# Patient Record
Sex: Male | Born: 1955 | Race: White | Hispanic: No | Marital: Married | Smoking: Current every day smoker
Health system: Southern US, Community
[De-identification: ages and names within clinical notes are randomized; demographics above are authoritative.]

---

## 2020-05-09 ENCOUNTER — Ambulatory Visit (INDEPENDENT_AMBULATORY_CARE_PROVIDER_SITE_OTHER): Payer: Worker's Compensation | Admitting: Orthopaedic Surgery

## 2020-05-09 ENCOUNTER — Other Ambulatory Visit: Payer: Self-pay

## 2020-05-09 ENCOUNTER — Encounter: Payer: Self-pay | Admitting: Orthopaedic Surgery

## 2020-05-09 DIAGNOSIS — S40021A Contusion of right upper arm, initial encounter: Secondary | ICD-10-CM | POA: Diagnosis not present

## 2020-05-09 DIAGNOSIS — S40011A Contusion of right shoulder, initial encounter: Secondary | ICD-10-CM | POA: Diagnosis not present

## 2020-05-09 NOTE — Progress Notes (Signed)
Office Visit Note   Patient: Jacob Stephenson           Date of Birth: [birth year]           MRN: [id number] Visit Date: 05/09/2020              Requested by: No referring provider defined for this encounter. PCP: Heloise Ochoa, NP   Assessment & Plan: Visit Diagnoses:  1. Contusion of multiple sites of right shoulder and upper arm, initial encounter     Plan: Work slip given for continue regular work I will recheck him in 2 weeks.  If he continues to improve will defer MRI imaging of his right shoulder.  Elbow laceration with closure appears to be doing well.  He can continue the ibuprofen 800 mg maximum of 2 a day once twice week he can take 1/3 tablet.  He also can use some plain Tylenol.  Work slip given for regular work.  Recheck 2 weeks.  Follow-Up Instructions: Return in about 2 weeks (around 05/23/2020).   Orders:  No orders of the defined types were placed in this encounter.  No orders of the defined types were placed in this encounter.     Procedures: No procedures performed   Clinical Data: No additional findings.   Subjective: Chief Complaint  Patient presents with  . 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  He had abrasions over the olecranon process.  Past history of gout has been taking ibuprofen has had trouble lifting his arm up over his head.  He states at the end the day his arm is been throbbing.  He had sutures applied to his elbow and was placed on antibiotics this now has an eschar present over the olecranon bursa without cellulitis.  He denies problems with elbow flexion extension.  He has noticed some numbness in the middle 3 fingers of his right hand none on his left hand.  He denies associated neck pain no balance issues no lower extremity problems.  Review of  Systems Positive for hypertension and past history of gout not on any medications in several years for his gout.  Objective: Vital Signs: BP 140/68   Pulse (!) 40   Ht 5\' 10"  (1.778 m)   Wt 250 lb (113.4 kg)   BMI 35.87 kg/m   Physical Exam Constitutional:      Appearance: He is well-developed.  HENT:     Head: Normocephalic and atraumatic.  Eyes:     Pupils: Pupils are equal, round, and reactive to light.  Neck:     Thyroid: No thyromegaly.     Trachea: No tracheal deviation.  Cardiovascular:     Rate and Rhythm: Normal rate.  Pulmonary:     Effort: Pulmonary effort is normal.     Breath sounds: No wheezing.  Abdominal:     General: Bowel sounds are normal.     Palpations: Abdomen is soft.  Skin:    General: Skin is warm and dry.     Capillary Refill: Capillary refill takes less than 2 seconds.  Neurological:     Mental Status: He is alert and oriented to person, place, and time.  Psychiatric:        Behavior: Behavior normal.        Thought  Content: Thought content normal.        Judgment: Judgment normal.     Ortho Exam patient has negative Spurling no brachial plexus tenderness.  Pain with abduction of the shoulder limited to 70 degrees.  He can flex the shoulder and get his hand on top of his head.  Full supination pronation.  Long head of the biceps moderately tender pain with resisted supraspinatus testing.  He has calluses present over hypothenar region of the right hand not on the left.  Some calluses directly over the carpal canal and some discomfort with carpal compression.  No thenar atrophy.  Interossei take good resistance.  Mild swelling in the right fingers and hand.  Normal heel toe gait.  2 cm eschar over the olecranon skin without cellulitis no significant olecranon bursal fluid.  Triceps takes good strength.  Upper extremity reflexes biceps triceps brachial radialis are 2+ and symmetrical.  Specialty Comments:  No specialty comments  available.  Imaging: No results found.   PMFS History: Patient Active Problem List   Diagnosis Date Noted  . Contusion of multiple sites of right shoulder and upper arm 05/09/2020   No past medical history on file.  No family history on file.   Social History   Occupational History  . Not on file  Tobacco Use  . Smoking status: Current Every Day Smoker  . Smokeless tobacco: Never Used  Substance and Sexual Activity  . Alcohol use: Not Currently  . Drug use: Not on file  . Sexual activity: Not on file

## 2020-05-23 ENCOUNTER — Other Ambulatory Visit: Payer: Self-pay | Admitting: Orthopaedic Surgery

## 2020-05-23 ENCOUNTER — Other Ambulatory Visit: Payer: Self-pay

## 2020-05-23 ENCOUNTER — Ambulatory Visit (INDEPENDENT_AMBULATORY_CARE_PROVIDER_SITE_OTHER): Payer: Worker's Compensation | Admitting: Orthopaedic Surgery

## 2020-05-23 ENCOUNTER — Encounter: Payer: Self-pay | Admitting: Orthopaedic Surgery

## 2020-05-23 VITALS — BP 137/68 | HR 45 | Ht 70.0 in | Wt 250.0 lb

## 2020-05-23 DIAGNOSIS — S40021D Contusion of right upper arm, subsequent encounter: Secondary | ICD-10-CM

## 2020-05-23 DIAGNOSIS — S40011D Contusion of right shoulder, subsequent encounter: Secondary | ICD-10-CM | POA: Diagnosis not present

## 2020-05-23 NOTE — Addendum Note (Signed)
Addended by: Penne Lash, Otis Dials on: 05/23/2020 09:58 AM   Modules accepted: Orders

## 2020-05-23 NOTE — Progress Notes (Signed)
Office Visit Note   Patient: Jacob Stephenson           Date of Birth: 12-23-1956           MRN: [id number] Visit Date: 05/23/2020              Requested by: Heloise Ochoa, NP [place],  [place] [address] PCP: Heloise Ochoa, NP   Assessment & Plan: Visit Diagnoses:  1. Contusion of multiple sites of right shoulder and upper arm, subsequent encounter     Plan: Patient is having persistent symptoms with supraspinatus weakness.  He is gotten slightly better in the last 2 weeks.  I recommend proceeding with MRI scan right shoulder for evaluation of his rotator cuff.  The elbow laceration is healing nicely with eschar no further treatment is required for this.  Office follow-up after shoulder MRI.  He remains on regular duty.  Follow-Up Instructions: return after MRI   Orders:  No orders of the defined types were placed in this encounter.  No orders of the defined types were placed in this encounter.     Procedures: No procedures performed   Clinical Data: No additional findings.   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  He injured his right shoulder has had persistent symptoms.  He states he has gotten a little bit better in the last 2 weeks but still has pain inability to abduct past 70 degrees for the right arm.  He has problems with pain at night.  He occasionally has some numbness in his fingers radial 3 fingers.  Numbness hand bothers him at night at times but he had some of this before the on-the-job injury to his shoulder.  Review of Systems 14 point system update unchanged from 05/09/2020 office visit.  Of note is that history of gout not on any current gout medication.   Objective: Vital Signs: BP 137/68 (BP Location: Left Arm,  Patient Position: Sitting)    Pulse (!) 45    Ht 5\' 10"  (1.778 m)    Wt 250 lb (113.4 kg)    BMI 35.87 kg/m   Physical Exam Constitutional:      Appearance: He is well-developed.  HENT:     Head: Normocephalic and atraumatic.  Eyes:     Pupils: Pupils are equal, round, and reactive to light.  Neck:     Thyroid: No thyromegaly.     Trachea: No tracheal deviation.  Cardiovascular:     Rate and Rhythm: Normal rate.  Pulmonary:     Effort: Pulmonary effort is normal.     Breath sounds: No wheezing.  Abdominal:     General: Bowel sounds are normal.     Palpations: Abdomen is soft.  Skin:    General: Skin is warm and dry.     Capillary Refill: Capillary refill takes less than 2 seconds.  Neurological:     Mental Status: He is alert and oriented to person, place, and time.  Psychiatric:        Behavior: Behavior normal.        Thought Content: Thought content normal.        Judgment: Judgment normal.     Ortho Exam patient has abduction only 70 degrees.  Long head of the biceps is intact.  He has good cervical range of motion.  No thenar atrophy.  Eschar over the olecranon noted where previous laceration which is healing no cellulitis no drainage.  Supraspinatus weakness with resisted testing.  Subscap and external rotation are strong.  Good biceps strength no distal biceps migration.  No thenar atrophy.  Specialty Comments:  No specialty comments available.  Imaging: No results found.   PMFS History: Patient Active Problem List   Diagnosis Date Noted   Contusion of multiple sites of right shoulder and upper arm 05/09/2020   No past medical history on file.  No family history on file.   Social History   Occupational History   Not on file  Tobacco Use   Smoking status: Current Every Day Smoker   Smokeless tobacco: Never Used  Substance and Sexual Activity   Alcohol use: Not Currently   Drug use: Not on file   Sexual activity: Not on file

## 2020-05-24 ENCOUNTER — Ambulatory Visit
Admission: RE | Admit: 2020-05-24 | Discharge: 2020-05-24 | Disposition: A | Discharge status: Home / Self Care | Payer: Managed Care, Other (non HMO) | Source: Ambulatory Visit | Attending: Orthopaedic Surgery | Admitting: Orthopaedic Surgery

## 2020-05-24 ENCOUNTER — Other Ambulatory Visit: Payer: Self-pay | Admitting: Orthopaedic Surgery

## 2020-05-24 DIAGNOSIS — Z77018 Contact with and (suspected) exposure to other hazardous metals: Secondary | ICD-10-CM

## 2020-05-24 DIAGNOSIS — S40011D Contusion of right shoulder, subsequent encounter: Secondary | ICD-10-CM

## 2020-05-24 DIAGNOSIS — S40021D Contusion of right upper arm, subsequent encounter: Secondary | ICD-10-CM

## 2020-05-26 ENCOUNTER — Other Ambulatory Visit: Payer: Self-pay

## 2020-05-26 ENCOUNTER — Ambulatory Visit
Admission: RE | Admit: 2020-05-26 | Discharge: 2020-05-26 | Disposition: A | Discharge status: Home / Self Care | Payer: Self-pay | Source: Ambulatory Visit | Attending: Orthopaedic Surgery | Admitting: Orthopaedic Surgery

## 2020-05-26 DIAGNOSIS — S40011D Contusion of right shoulder, subsequent encounter: Secondary | ICD-10-CM

## 2020-05-26 DIAGNOSIS — S40021D Contusion of right upper arm, subsequent encounter: Secondary | ICD-10-CM

## 2020-05-26 DIAGNOSIS — Z77018 Contact with and (suspected) exposure to other hazardous metals: Secondary | ICD-10-CM

## 2020-05-28 ENCOUNTER — Ambulatory Visit
Admission: RE | Admit: 2020-05-28 | Discharge: 2020-05-28 | Disposition: A | Discharge status: Home / Self Care | Payer: Worker's Compensation | Source: Ambulatory Visit | Attending: Orthopaedic Surgery | Admitting: Orthopaedic Surgery

## 2020-05-28 DIAGNOSIS — S40011D Contusion of right shoulder, subsequent encounter: Secondary | ICD-10-CM

## 2020-05-28 DIAGNOSIS — S40021D Contusion of right upper arm, subsequent encounter: Secondary | ICD-10-CM

## 2020-05-31 ENCOUNTER — Ambulatory Visit (INDEPENDENT_AMBULATORY_CARE_PROVIDER_SITE_OTHER): Payer: Worker's Compensation | Admitting: Orthopaedic Surgery

## 2020-05-31 ENCOUNTER — Encounter: Payer: Self-pay | Admitting: Orthopaedic Surgery

## 2020-05-31 DIAGNOSIS — S46011D Strain of muscle(s) and tendon(s) of the rotator cuff of right shoulder, subsequent encounter: Secondary | ICD-10-CM | POA: Diagnosis not present

## 2020-05-31 DIAGNOSIS — M75121 Complete rotator cuff tear or rupture of right shoulder, not specified as traumatic: Secondary | ICD-10-CM | POA: Insufficient documentation

## 2020-05-31 DIAGNOSIS — M7521 Bicipital tendinitis, right shoulder: Secondary | ICD-10-CM

## 2020-05-31 NOTE — Progress Notes (Signed)
Office Visit Note   Patient: Jacob Stephenson           Date of Birth: 08-14-1956           MRN: [id number] Visit Date: 05/31/2020              Requested by: Heloise Ochoa, NP [address],  [place] [address] PCP: Heloise Ochoa, NP   Assessment & Plan: Visit Diagnoses:  1. Traumatic complete tear of right rotator cuff, subsequent encounter   2. Tendonitis of long head of biceps brachii of right shoulder     Plan: Patient has severe tenderness supraspinatus with full-thickness tear 13 mm retraction but the muscle does not have atrophy.  The rest the rotator cuff is intact he does have moderate tendinosis of the intra-articular portion of long of the biceps which may be a portion of his symptoms.  We discussed recommendations of shoulder arthroscopy biceps tenodesis and rotator cuff repair.  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  He be in a sling for up to 6 weeks coming out the sling for shoulder exercises.  He may be able do most of them on his own if not then formal physical therapy would be recommended.  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  We discussed outpatient surgery possible interscalene block.  With his past smoking history they may choose not to perform a block.  We discussed hemidiaphragm paralysis with the block and sometimes if patient is a smoker this can aggravate some dyspnea.  We discussed using some local anesthesia and expiratory will locally to help with postop pain.  We will schedule him for outpatient surgery possible overnight stay.  Anesthesia team can decide about possible block just before the procedure.  Procedure was discussed with patient informed consent obtained questions were elicited and answered.  Follow-Up Instructions: No follow-ups on file.   Orders:  No orders of the  defined types were placed in this encounter.  No orders of the defined types were placed in this encounter.     Procedures: No procedures performed   Clinical Data: No additional findings.   Subjective: Chief Complaint  Patient presents with   Right Shoulder - Pain, Follow-up    MRI Right Shoulder Review    HPI 64 year old male returns with ongoing problems with right shoulder pain with mild problems in the morning with anti-inflammatories and significant increased problems at night with difficulty sleeping pain with outstretched reaching and overhead activity.  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  Shoulder is not improved despite conservative treatment with anti-inflammatories.  Patient originally injured on on-the-job injury when he fell off of the side of the truck on 04/26/2020.  He had some sutures applied to his elbow which is healed nicely.  Persistent problems with the shoulder since the fall.  MRI scan has been obtained and is available for review.  This shows full-thickness rotator cuff tear with some retraction and also some chronic biceps tendinosis/partial tearing.  Review of Systems positive for gout in the past, hypertension, obesity otherwise negative is obtains HPI.  Patient has a past smoker occasionally gets short of breath if he does stairs but no chest pain.  Objective: Vital Signs: Ht 5\' 10"  (1.778 m)    Wt 250 lb (113.4 kg)    BMI 35.87 kg/m   Physical Exam Constitutional:      Appearance: He is well-developed.  HENT:     Head: Normocephalic and atraumatic.  Eyes:     Pupils: Pupils are equal, round, and reactive to light.  Neck:     Thyroid: No thyromegaly.     Trachea: No tracheal deviation.  Cardiovascular:     Rate and Rhythm: Normal rate.  Pulmonary:     Effort: Pulmonary effort is normal.     Breath sounds: No wheezing.  Abdominal:     General: Bowel sounds are  normal.     Palpations: Abdomen is soft.  Skin:    General: Skin is warm and dry.     Capillary Refill: Capillary refill takes less than 2 seconds.  Neurological:     Mental Status: He is alert and oriented to person, place, and time.  Psychiatric:        Behavior: Behavior normal.        Thought Content: Thought content normal.        Judgment: Judgment normal.     Ortho Exam patient with positive drop arm test pain with resisted testing right shoulder long head of the biceps is tender painful in the bicipital groove.  Negative Yergason.  No subluxation of the shoulder.  Elbow laceration completely healed.  Patient has some calluses over his hands.  Good triceps biceps strength.  No thenar atrophy.  Specialty Comments:  No specialty comments available.  Imaging:  EXAM: MRI OF THE RIGHT SHOULDER WITHOUT CONTRAST  TECHNIQUE: Multiplanar, multisequence MR imaging of the shoulder was performed. No intravenous contrast was administered.  COMPARISON:  None.  FINDINGS: Rotator cuff: Severe tendinosis of the supraspinatus tendon with a large full-thickness tear of the supraspinatus tendon measuring approximately 13 mm went intact anterior fibers. Infraspinatus tendon is intact. Teres minor tendon is intact. Subscapularis tendon is intact.  Muscles: No atrophy or fatty replacement of nor abnormal signal within, the muscles of the rotator cuff.  Biceps long head: Moderate tendinosis of the intra-articular portion of the long head of the biceps tendon.  Acromioclavicular Joint: Moderate arthropathy of the acromioclavicular joint. Type I acromion. Moderate amount of subacromial/subdeltoid bursal fluid  Glenohumeral Joint: No joint effusion. Partial-thickness cartilage loss of the glenohumeral joint with subchondral reactive marrow changes in the inferior glenoid.  Labrum: Limited evaluation of the labrum secondary lack of intra-articular fluid. Posterior labral  tear.  Bones:  No acute osseous abnormality. No aggressive osseous lesion.  Other: No fluid collection or hematoma.  IMPRESSION: 1. Severe tendinosis of the supraspinatus tendon with a large full-thickness tear of the supraspinatus tendon measuring approximately 13 mm went intact anterior fibers. 2. Moderate tendinosis of the intra-articular portion of the long head of the biceps tendon. 3. Mild osteoarthritis of the glenohumeral joint.   Electronically Signed   By:   On: 05/28/2020 19:47   PMFS History: Patient Active Problem List   Diagnosis Date Noted   Complete tear of right rotator cuff 05/31/2020   Tendonitis of long head of biceps brachii of right shoulder 05/31/2020   Contusion of multiple sites of right shoulder and upper arm 05/09/2020   No past medical history on file.  No family history on file.   Social History   Occupational History   Not on file  Tobacco Use   Smoking status: Current  Every Day Smoker   Smokeless tobacco: Never Used  Substance and Sexual Activity   Alcohol use: Not Currently   Drug use: Not on file   Sexual activity: Not on file

## 2020-07-10 ENCOUNTER — Telehealth: Payer: Self-pay | Admitting: Orthopaedic Surgery

## 2020-07-10 ENCOUNTER — Other Ambulatory Visit: Payer: Self-pay | Admitting: Orthopaedic Surgery

## 2020-07-10 ENCOUNTER — Encounter: Payer: Self-pay | Admitting: Orthopaedic Surgery

## 2020-07-10 DIAGNOSIS — M75121 Complete rotator cuff tear or rupture of right shoulder, not specified as traumatic: Secondary | ICD-10-CM

## 2020-07-10 DIAGNOSIS — M7521 Bicipital tendinitis, right shoulder: Secondary | ICD-10-CM

## 2020-07-10 MED ORDER — OXYCODONE-ACETAMINOPHEN 5-325 MG PO TABS: 1.0000 | ORAL_TABLET | Freq: Four times a day (QID) | ORAL | 0 refills | Status: AC | PRN

## 2020-07-10 NOTE — Telephone Encounter (Signed)
Patient's daughter called.   She is requesting a call back to clarify the patient's after surgery instructions.   Call back: [phone]

## 2020-07-10 NOTE — Telephone Encounter (Signed)
Can you please advise?

## 2020-07-10 NOTE — Telephone Encounter (Signed)
I called discussed.  

## 2020-07-11 NOTE — Telephone Encounter (Signed)
noted 

## 2020-07-13 ENCOUNTER — Telehealth: Payer: Self-pay | Admitting: Orthopaedic Surgery

## 2020-07-13 ENCOUNTER — Telehealth: Payer: Self-pay | Admitting: Radiology

## 2020-07-13 ENCOUNTER — Telehealth: Payer: Self-pay

## 2020-07-13 NOTE — Telephone Encounter (Signed)
FYI

## 2020-07-13 NOTE — Telephone Encounter (Signed)
Jacob Stephenson case manager called in to notify tha patient is in ER having trouble breathing

## 2020-07-13 NOTE — Telephone Encounter (Signed)
Patient's daughter called triage line this morning stating that he had surgery on Monday morning and has been having difficulty swallowing and feels that he is choking. He is even unable to get something like yogurt down.  She states that he is now having some difficulty breathing, even after a breathing treatment.  Advised to take patient to nearest ED for evaluation.

## 2020-07-13 NOTE — Telephone Encounter (Signed)
Yes , we know  this since we told him to go since he had dyspnea. Have talked with his daughter.

## 2020-07-19 ENCOUNTER — Inpatient Hospital Stay: Payer: Self-pay | Admitting: Orthopedic Surgery

## 2020-07-19 ENCOUNTER — Ambulatory Visit (INDEPENDENT_AMBULATORY_CARE_PROVIDER_SITE_OTHER): Payer: Managed Care, Other (non HMO) | Admitting: Orthopaedic Surgery

## 2020-07-19 ENCOUNTER — Encounter: Payer: Self-pay | Admitting: Orthopaedic Surgery

## 2020-07-19 DIAGNOSIS — M7521 Bicipital tendinitis, right shoulder: Secondary | ICD-10-CM

## 2020-07-19 DIAGNOSIS — S46011D Strain of muscle(s) and tendon(s) of the rotator cuff of right shoulder, subsequent encounter: Secondary | ICD-10-CM

## 2020-07-19 MED ORDER — TRAMADOL HCL 50 MG PO TABS: 50.0000 mg | ORAL_TABLET | Freq: Two times a day (BID) | ORAL | 0 refills | Status: AC | PRN

## 2020-07-19 NOTE — Progress Notes (Signed)
   Post-Op Visit Note   Patient: Jacob Stephenson           Date of Birth: 07-27-1956           MRN: [id number] Visit Date: 07/19/2020 PCP: Carola Frost, NP   Assessment & Plan: 64 year old male returns post shoulder arthroscopy 07/10/2020 with right shoulder biceps tenodesis and open rotator cuff repair.  Patient is here with  christal Jacquenette Shone case manager has been assigned to his case her fax number is [phone] and telephone [phone].  Chief Complaint:  Chief Complaint  Patient presents with  . Right Shoulder - Routine Post Op   Visit Diagnoses:  1. Traumatic complete tear of right rotator cuff, subsequent encounter   2. Tendonitis of long head of biceps brachii of right shoulder     Plan: We discussed arm hanging at the side doing small circles, elephant swings.  He can discontinue the strap with a shoulder immobilizer and just continue with the sling.  He can remove the sling keep his arm across his chest for when he takes a shower.  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  Recheck 4 weeks.  We discussed he may or may not need formal physical therapy.  Work slip given no work x6 weeks.  Recheck 4 weeks.  Follow-Up Instructions: No follow-ups on file.   Orders:  No orders of the defined types were placed in this encounter.  No orders of the defined types were placed in this encounter.   Imaging: No results found.  PMFS History: Patient Active Problem List   Diagnosis Date Noted  . Complete tear of right rotator cuff 05/31/2020  . Tendonitis of long head of biceps brachii of right shoulder 05/31/2020  . Contusion of multiple sites of right shoulder and upper arm 05/09/2020   History reviewed. No pertinent past medical history.  History reviewed. No pertinent family history.  History reviewed. No pertinent surgical history. Social History   Occupational History  . Not on file  Tobacco Use  . Smoking status: Current Every Day Smoker  . Smokeless tobacco: Never Used   Substance and Sexual Activity  . Alcohol use: Not Currently  . Drug use: Not on file  . Sexual activity: Not on file

## 2020-08-18 ENCOUNTER — Ambulatory Visit (INDEPENDENT_AMBULATORY_CARE_PROVIDER_SITE_OTHER): Payer: Managed Care, Other (non HMO) | Admitting: Orthopaedic Surgery

## 2020-08-18 ENCOUNTER — Encounter: Payer: Self-pay | Admitting: Orthopaedic Surgery

## 2020-08-18 VITALS — BP 168/86 | Ht 70.0 in | Wt 250.0 lb

## 2020-08-18 DIAGNOSIS — M7521 Bicipital tendinitis, right shoulder: Secondary | ICD-10-CM

## 2020-08-18 DIAGNOSIS — S46011D Strain of muscle(s) and tendon(s) of the rotator cuff of right shoulder, subsequent encounter: Secondary | ICD-10-CM

## 2020-08-18 NOTE — Progress Notes (Signed)
   Post-Op Visit Note   Patient: Jacob Stephenson           Date of Birth: [birth year]           MRN: [id number] Visit Date: 08/18/2020 PCP: Carola Frost, NP   Assessment & Plan: Post right rotator cuff repair biceps tenodesis.  We have nurses present with him again today.  Prescription given for therapy.  He is already at work keep his arm in a sling.  Work slip given continued modified work keeping his arm in the sling.  He will be removing his arm from the sling when he does his therapy.  I will recheck him in 5 weeks.  Chief Complaint:  Chief Complaint  Patient presents with  . Right Shoulder - Follow-up    07/10/2020 right shoulder arthroscopy, biceps tenodesis, open RCR   Visit Diagnoses: No diagnosis found.  Plan: Incisions look good therapy will be started expected MMI is about 2 months or so.  Follow-Up Instructions: No follow-ups on file.   Orders:  No orders of the defined types were placed in this encounter.  No orders of the defined types were placed in this encounter.   Imaging: No results found.  PMFS History: Patient Active Problem List   Diagnosis Date Noted  . Complete tear of right rotator cuff 05/31/2020  . Tendonitis of long head of biceps brachii of right shoulder 05/31/2020  . Contusion of multiple sites of right shoulder and upper arm 05/09/2020   No past medical history on file.  No family history on file.  No past surgical history on file. Social History   Occupational History  . Not on file  Tobacco Use  . Smoking status: Current Every Day Smoker  . Smokeless tobacco: Never Used  Substance and Sexual Activity  . Alcohol use: Not Currently  . Drug use: Not on file  . Sexual activity: Not on file

## 2020-08-18 NOTE — Addendum Note (Signed)
Addended by: Rogers Seeds on: 08/18/2020 01:31 PM   Modules accepted: Orders

## 2020-09-22 ENCOUNTER — Ambulatory Visit (INDEPENDENT_AMBULATORY_CARE_PROVIDER_SITE_OTHER): Payer: Worker's Compensation | Admitting: Orthopaedic Surgery

## 2020-09-22 VITALS — BP 181/81 | HR 42 | Ht 70.0 in | Wt 250.0 lb

## 2020-09-22 DIAGNOSIS — S46011D Strain of muscle(s) and tendon(s) of the rotator cuff of right shoulder, subsequent encounter: Secondary | ICD-10-CM

## 2020-09-22 DIAGNOSIS — M7521 Bicipital tendinitis, right shoulder: Secondary | ICD-10-CM

## 2020-09-22 NOTE — Progress Notes (Signed)
   Post-Op Visit Note   Patient: Jacob Stephenson           Date of Birth: 1956-02-16           MRN: [id number] Visit Date: 09/22/2020 PCP: Carola Frost, NP   Assessment & Plan: 64 year old male returns post right shoulder biceps tenodesis and rotator cuff repair 07/10/2020.  This is an on-the-job injury.  He is here again with Ms. Dayton Martes, RN fax number [phone].  Phone numbers unchanged.  Patient still working can get his arm up over his head he is doing therapy is taking ibuprofen for pain and states his shoulder is doing "great".  Job description is brought by his rehab nurse and was reviewed today.  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  Chief Complaint:  Chief Complaint  Patient presents with  . Right Shoulder - Follow-up   Visit Diagnoses:  1. Tendonitis of long head of biceps brachii of right shoulder   2. Traumatic complete tear of right rotator cuff, subsequent encounter     Plan: I plan to check patient back for final visit in 1 month and will sign impairment rating in line with [employer] act guidelines.  Follow-Up Instructions: Return in about 1 month (around 10/23/2020).   Orders:  No orders of the defined types were placed in this encounter.  No orders of the defined types were placed in this encounter.   Imaging: No results found.  PMFS History: Patient Active Problem List   Diagnosis Date Noted  . Complete tear of right rotator cuff 05/31/2020  . Tendonitis of long head of biceps brachii of right shoulder 05/31/2020  . Contusion of multiple sites of right shoulder and upper arm 05/09/2020   No past medical history on file.  No family history on file.  No past surgical history on file. Social History   Occupational History  . Not on file  Tobacco Use  . Smoking status: Current Every Day Smoker  . Smokeless tobacco: Never Used  Substance and Sexual Activity  . Alcohol use: Not  Currently  . Drug use: Not on file  . Sexual activity: Not on file

## 2020-10-24 ENCOUNTER — Ambulatory Visit: Payer: Managed Care, Other (non HMO) | Admitting: Orthopaedic Surgery

## 2020-11-10 ENCOUNTER — Encounter: Payer: Self-pay | Admitting: Orthopaedic Surgery

## 2020-11-10 ENCOUNTER — Other Ambulatory Visit: Payer: Self-pay

## 2020-11-10 ENCOUNTER — Ambulatory Visit (INDEPENDENT_AMBULATORY_CARE_PROVIDER_SITE_OTHER): Payer: Worker's Compensation | Admitting: Orthopaedic Surgery

## 2020-11-10 VITALS — BP 146/81 | HR 58 | Ht 70.0 in | Wt 236.0 lb

## 2020-11-10 DIAGNOSIS — S46011D Strain of muscle(s) and tendon(s) of the rotator cuff of right shoulder, subsequent encounter: Secondary | ICD-10-CM

## 2020-11-10 DIAGNOSIS — M7521 Bicipital tendinitis, right shoulder: Secondary | ICD-10-CM

## 2020-11-10 NOTE — Progress Notes (Signed)
Office Visit Note   Patient: Jacob Stephenson           Date of Birth: 1956/04/04           MRN: [id number] Visit Date: 11/10/2020              Requested by: Carola Frost, NP [address],  [place] [address] PCP: Carola Frost, NP   Assessment & Plan: Visit Diagnoses:  1. Tendonitis of long head of biceps brachii of right shoulder   2. Traumatic complete tear of right rotator cuff, subsequent encounter     Plan: Based on patient's rotator cuff repair with tenodesis of the biceps tendon the rate is impairment 15% of the right arm.  Patient is back at work he still has some residual weakness and has good range of motion.  He has problems holding his arm overhead for period of time but is not having problems getting dressed or bathing.  Patient is at [facility] office follow-up as needed final impairment 15% of the right arm.  Patient is doing regular work at full duty and this is unchanged and will continue.  Follow-Up Instructions: Patient is rating the least he said with the surgical result return as needed.  Orders:  No orders of the defined types were placed in this encounter.  No orders of the defined types were placed in this encounter.     Procedures: No procedures performed   Clinical Data: No additional findings.   Subjective: Chief Complaint  Patient presents with  . Right Shoulder - Follow-up    07/10/2020 right shoulder biceps tenodesis and RCR    HPI 64 year old male returns post on-the-job injury with right shoulder biceps tenodesis and rotator cuff repair on 07/10/2020.  Maryann Conners RN medical case manager fax number [phone] is present with him today.  Patient is back doing work activities.    Review of Systemsunshanged   Objective: Vital Signs: BP (!) 146/81   Pulse (!) 58   Ht 5\' 10"  (1.778 m)   Wt 236 lb (107 kg)   BMI 33.86 kg/m   Physical Exam Constitutional:      Appearance: He is well-developed.  HENT:     Head: Normocephalic  and atraumatic.  Eyes:     Pupils: Pupils are equal, round, and reactive to light.  Neck:     Thyroid: No thyromegaly.     Trachea: No tracheal deviation.  Cardiovascular:     Rate and Rhythm: Normal rate.  Pulmonary:     Effort: Pulmonary effort is normal.     Breath sounds: No wheezing.  Abdominal:     General: Bowel sounds are normal.     Palpations: Abdomen is soft.  Skin:    General: Skin is warm and dry.     Capillary Refill: Capillary refill takes less than 2 seconds.  Neurological:     Mental Status: He is alert and oriented to person, place, and time.  Psychiatric:        Behavior: Behavior normal.        Thought Content: Thought content normal.        Judgment: Judgment normal.     Ortho Exam patient is able get his arm up over his head he is happy the surgical result  Specialty Comments:  No specialty comments available.  Imaging: No results found.   PMFS History: Patient Active Problem List   Diagnosis Date Noted  . Complete tear of right rotator cuff 05/31/2020  .  Tendonitis of long head of biceps brachii of right shoulder 05/31/2020  . Contusion of multiple sites of right shoulder and upper arm 05/09/2020   No past medical history on file.  No family history on file.  No past surgical history on file. Social History   Occupational History  . Not on file  Tobacco Use  . Smoking status: Current Every Day Smoker  . Smokeless tobacco: Never Used  Substance and Sexual Activity  . Alcohol use: Not Currently  . Drug use: Not on file  . Sexual activity: Not on file

## 2021-07-11 IMAGING — CR DG ORBITS FOR FOREIGN BODY
2 series · 2 of 2 positions shown · non-contrast
Comparison: None.

CLINICAL DATA: Metal working/exposure; clearance prior to MRI.
63-year-old male

EXAM:
ORBITS FOR FOREIGN BODY - 2 VIEW

[w orbit pa (1 of 2)]
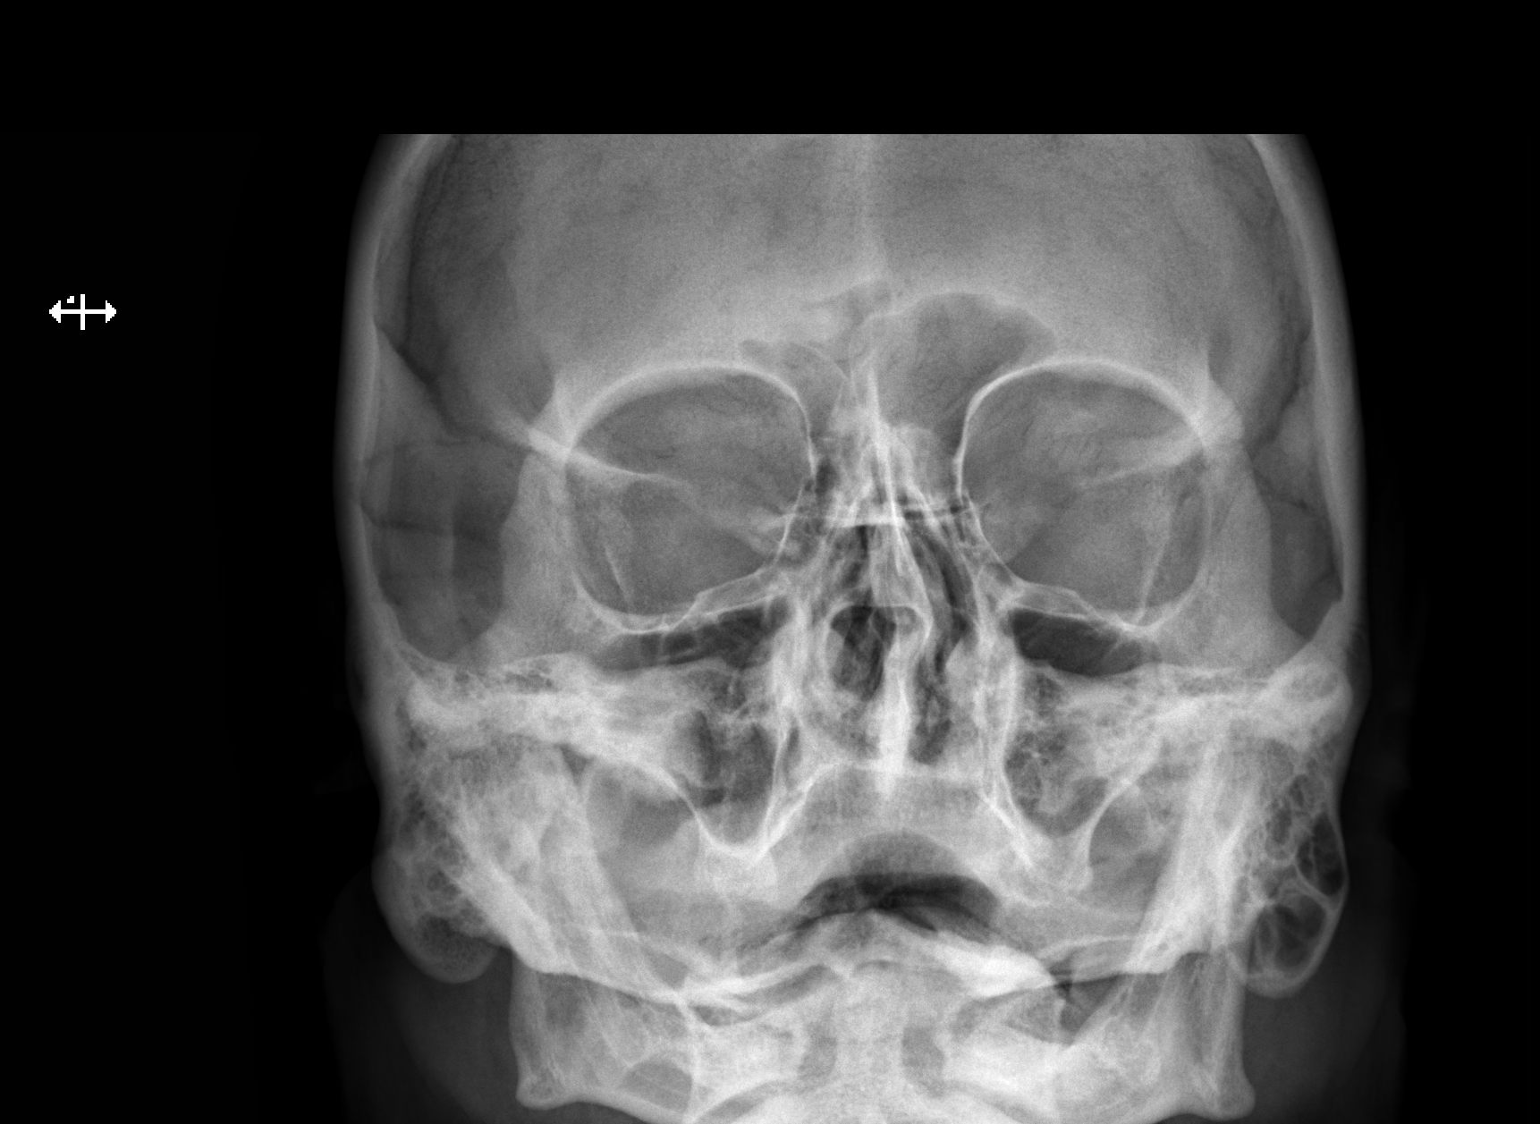

[w orbit pa (2 of 2)]
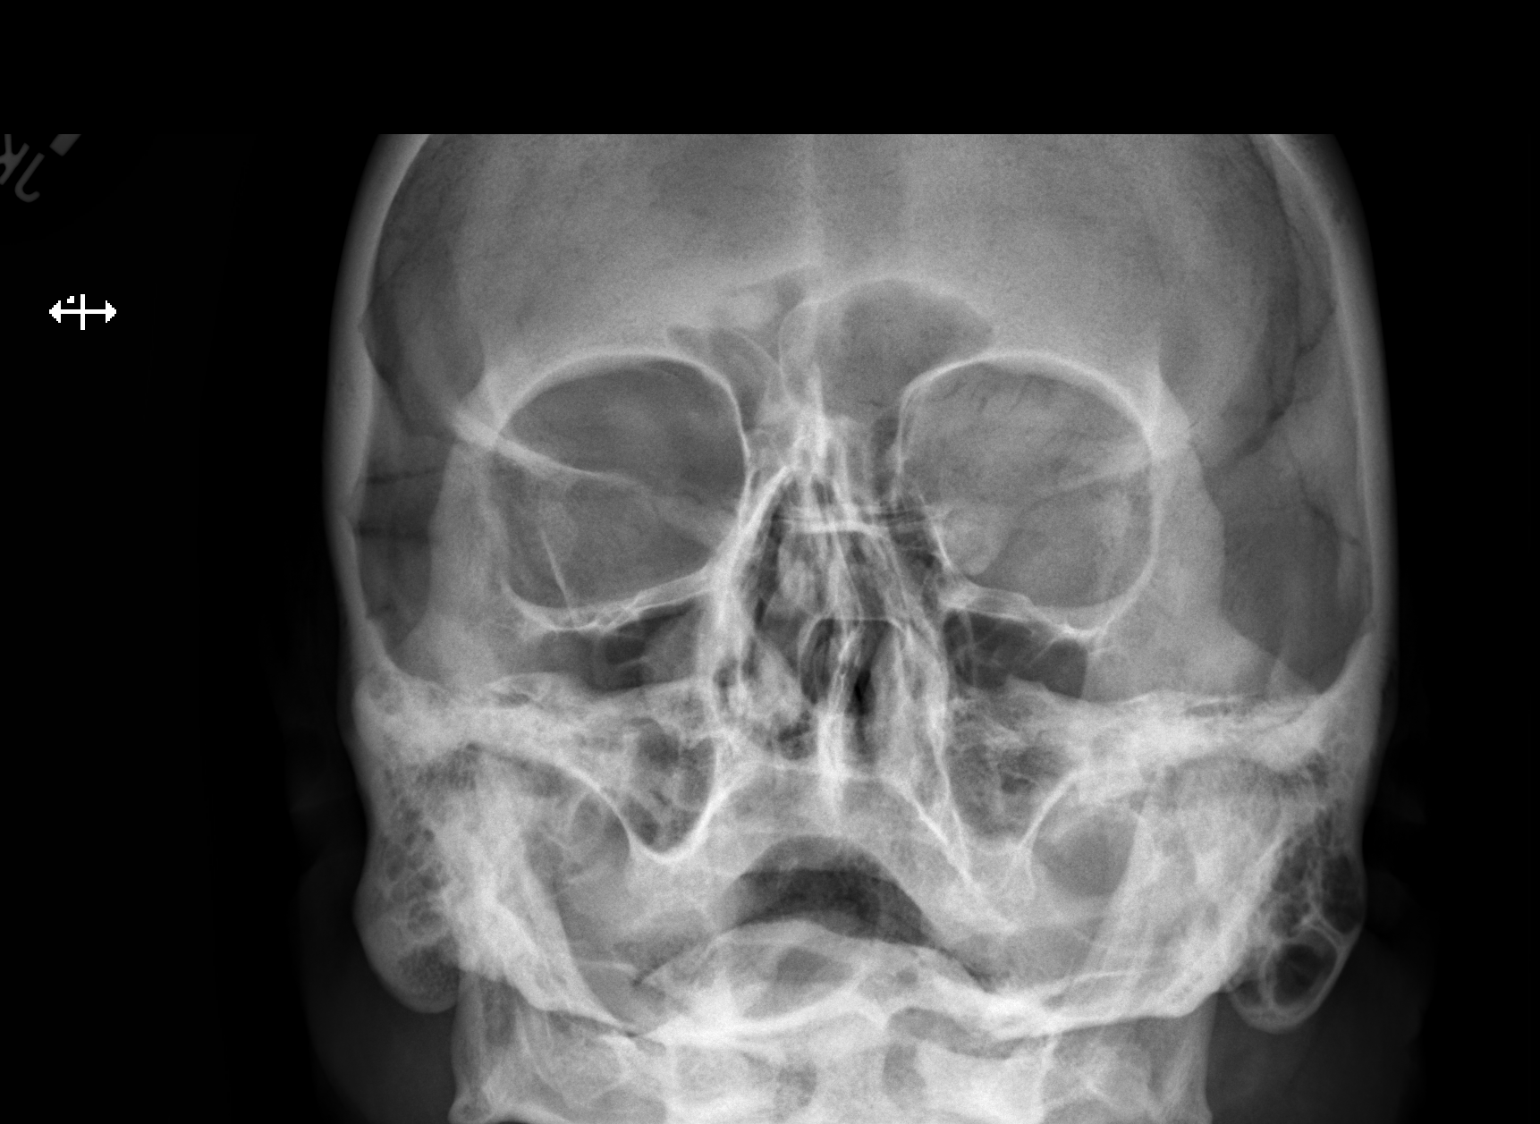

[2 of 2 positions shown; findings below may reference images not displayed]

FINDINGS: There is no evidence of metallic foreign body within the orbits. No
significant bone abnormality identified.
IMPRESSION: No evidence of metallic foreign body within the orbits.
